# Patient Record
Sex: Male | Born: 1965 | Race: White | Hispanic: No | Marital: Married | Smoking: Former smoker
Health system: Southern US, Community
[De-identification: ages and names within clinical notes are randomized; demographics above are authoritative.]

## PROBLEM LIST (undated history)

## (undated) DIAGNOSIS — M1990 Unspecified osteoarthritis, unspecified site: Secondary | ICD-10-CM

## (undated) DIAGNOSIS — K219 Gastro-esophageal reflux disease without esophagitis: Secondary | ICD-10-CM

## (undated) DIAGNOSIS — Z87442 Personal history of urinary calculi: Secondary | ICD-10-CM

## (undated) DIAGNOSIS — E119 Type 2 diabetes mellitus without complications: Secondary | ICD-10-CM

## (undated) DIAGNOSIS — G709 Myoneural disorder, unspecified: Secondary | ICD-10-CM

## (undated) DIAGNOSIS — I10 Essential (primary) hypertension: Secondary | ICD-10-CM

## (undated) HISTORY — PX: OTHER SURGICAL HISTORY: SHX169

## (undated) HISTORY — PX: KIDNEY STONE SURGERY: SHX686

---

## 2005-11-28 ENCOUNTER — Encounter: Admission: RE | Admit: 2005-11-28 | Discharge: 2005-11-28 | Payer: Self-pay | Admitting: Orthopedic Surgery

## 2014-05-10 ENCOUNTER — Other Ambulatory Visit: Payer: Self-pay | Admitting: *Deleted

## 2015-07-03 DIAGNOSIS — I10 Essential (primary) hypertension: Secondary | ICD-10-CM | POA: Insufficient documentation

## 2015-07-03 DIAGNOSIS — E785 Hyperlipidemia, unspecified: Secondary | ICD-10-CM | POA: Insufficient documentation

## 2015-07-03 DIAGNOSIS — E119 Type 2 diabetes mellitus without complications: Secondary | ICD-10-CM | POA: Insufficient documentation

## 2015-08-01 DIAGNOSIS — I700 Atherosclerosis of aorta: Secondary | ICD-10-CM | POA: Insufficient documentation

## 2015-08-01 DIAGNOSIS — M5136 Other intervertebral disc degeneration, lumbar region: Secondary | ICD-10-CM | POA: Insufficient documentation

## 2015-08-01 DIAGNOSIS — M159 Polyosteoarthritis, unspecified: Secondary | ICD-10-CM | POA: Insufficient documentation

## 2015-08-01 DIAGNOSIS — M51369 Other intervertebral disc degeneration, lumbar region without mention of lumbar back pain or lower extremity pain: Secondary | ICD-10-CM | POA: Insufficient documentation

## 2015-10-09 DIAGNOSIS — R319 Hematuria, unspecified: Secondary | ICD-10-CM | POA: Insufficient documentation

## 2016-02-16 DIAGNOSIS — Z79899 Other long term (current) drug therapy: Secondary | ICD-10-CM | POA: Insufficient documentation

## 2016-11-23 DIAGNOSIS — K219 Gastro-esophageal reflux disease without esophagitis: Secondary | ICD-10-CM | POA: Insufficient documentation

## 2017-06-13 ENCOUNTER — Other Ambulatory Visit: Payer: Self-pay | Admitting: Orthopedic Surgery

## 2017-06-14 ENCOUNTER — Encounter (HOSPITAL_BASED_OUTPATIENT_CLINIC_OR_DEPARTMENT_OTHER): Payer: Self-pay | Admitting: *Deleted

## 2017-06-15 ENCOUNTER — Other Ambulatory Visit: Payer: Self-pay

## 2017-06-15 ENCOUNTER — Encounter (HOSPITAL_BASED_OUTPATIENT_CLINIC_OR_DEPARTMENT_OTHER): Payer: Self-pay | Admitting: *Deleted

## 2017-06-15 NOTE — H&P (View-Only) (Signed)
   06/15/17 1220  OBSTRUCTIVE SLEEP APNEA  Have you ever been diagnosed with sleep apnea through a sleep study? No  Do you snore loudly (loud enough to be heard through closed doors)?  0  Do you often feel tired, fatigued, or sleepy during the daytime (such as falling asleep during driving or talking to someone)? 0  Has anyone observed you stop breathing during your sleep? 0  Do you have, or are you being treated for high blood pressure? 1  BMI more than 35 kg/m2? 1  Age > 50 (1-yes) 1  Neck circumference greater than:Male 16 inches or larger, Male 17inches or larger? 0  Male Gender (Yes=1) 1  Obstructive Sleep Apnea Score 4   

## 2017-06-15 NOTE — Progress Notes (Signed)
   06/15/17 1220  OBSTRUCTIVE SLEEP APNEA  Have you ever been diagnosed with sleep apnea through a sleep study? No  Do you snore loudly (loud enough to be heard through closed doors)?  0  Do you often feel tired, fatigued, or sleepy during the daytime (such as falling asleep during driving or talking to someone)? 0  Has anyone observed you stop breathing during your sleep? 0  Do you have, or are you being treated for high blood pressure? 1  BMI more than 35 kg/m2? 1  Age > 50 (1-yes) 1  Neck circumference greater than:Male 16 inches or larger, Male 17inches or larger? 0  Male Gender (Yes=1) 1  Obstructive Sleep Apnea Score 4

## 2017-06-16 NOTE — H&P (Addendum)
Russell Mckenzie is an 52 y.o. male.   CC / Reason for Visit: Left thumb pain HPI: This patient is a 52 year old male who presents for evaluation of pain at the base of the left thumb.  He has apparently been previously evaluated and treated in [place], as well as one time in [facility] by Dr. Stanford Scotland.  He reports he has had 3 injections and only short-term relief following each.  He has used several types of braces, and still has incapacitating thumb pain.  He understands there are 3 main approaches to treatment at this point surgically, including fusion, implant arthroplasty, and essentially a form of suspension arthroplasty.  He is ready to proceed surgically at this point.  Past Medical History:  Diagnosis Date  . Arthritis    OA left thumb  . Diabetes mellitus without complication (HCC)   . GERD (gastroesophageal reflux disease)   . History of kidney stones   . Hypertension   . Neuromuscular disorder [facility])     Past Surgical History:  Procedure Laterality Date  . KIDNEY STONE SURGERY      History reviewed. No pertinent family history. Social History:  reports that he has been smoking.  His smokeless tobacco use includes snuff. He reports that he does not drink alcohol or use drugs.  Allergies:  Allergies  Allergen Reactions  . Penicillins Anaphylaxis    Can't breathe    No medications prior to admission.    No results found for this or any previous visit (from the past 48 hour(s)). No results found.  Review of Systems  All other systems reviewed and are negative.   Height 6\' 3"  (1.905 m), weight (!) 137 kg (302 lb). Physical Exam  Constitutional:  WD, WN, NAD HEENT:  NCAT, EOMI Neuro/Psych:  Alert & oriented to person, place, and time; appropriate mood & affect Lymphatic: No generalized UE edema or lymphadenopathy Extremities / MSK:  Both UE are normal with respect to appearance, ranges of motion, joint stability, muscle strength/tone, sensation, & perfusion except  as otherwise noted:  The left thumb is exquisitely tender at the [facility] joint, worsened with the slightest bit of TMC stress and grind testing.  Crepitus is noted.  MP joint is stable both who applied radial and ulnar stress, as well as hyperextension stress, with only a few degrees of hyperextension at the MP joint.  Key pinch: Right 20/left 22 but tip pinch: Right 13/left 2 and painful  Labs / Xrays:  Multiple views of the left hand to include TMC series reveal TMC osteoarthritis with full-thickness cartilage loss, with relatively well-preserved other joint spaces.  About 25% subluxation also noted.  Assessment: Grade 3 left TMC osteoarthritis  Plan:  I discussed these findings with him and reviewed brought in specific options.  I recommended implant arthroplasty with the Stablyx implant.  I reviewed expectations and course for recovery and rehab.  We discussed the likelihood of remaining out of work for about a week or so, then possibility of returning to work with restrictions.  Obviously, this would have to be condition driven and open for modification. He will contact Rosanne Sack, our surgery coordinator regarding further planning.  The details of the operative procedure were discussed with the patient.  Questions were invited and answered.  In addition to the goal of the procedure, the risks of the procedure to include but not limited to bleeding; infection; damage to the nerves or blood vessels that could result in bleeding, numbness, weakness, chronic pain, and  the need for additional procedures; stiffness; the need for revision surgery; and anesthetic risks were reviewed.  No specific outcome was guaranteed or implied.  Informed consent was obtained.  Jodi Marbleavid A Caraline Deutschman, MD 06/16/2017, 5:06 PM

## 2017-06-17 ENCOUNTER — Encounter (HOSPITAL_BASED_OUTPATIENT_CLINIC_OR_DEPARTMENT_OTHER)
Admission: RE | Admit: 2017-06-17 | Discharge: 2017-06-17 | Disposition: A | Discharge status: Home / Self Care | Payer: 59 | Source: Ambulatory Visit | Attending: Orthopedic Surgery | Admitting: Orthopedic Surgery

## 2017-06-17 DIAGNOSIS — Z880 Allergy status to penicillin: Secondary | ICD-10-CM | POA: Diagnosis not present

## 2017-06-17 DIAGNOSIS — I10 Essential (primary) hypertension: Secondary | ICD-10-CM | POA: Diagnosis not present

## 2017-06-17 DIAGNOSIS — Z79899 Other long term (current) drug therapy: Secondary | ICD-10-CM | POA: Diagnosis not present

## 2017-06-17 DIAGNOSIS — K219 Gastro-esophageal reflux disease without esophagitis: Secondary | ICD-10-CM | POA: Diagnosis not present

## 2017-06-17 DIAGNOSIS — E119 Type 2 diabetes mellitus without complications: Secondary | ICD-10-CM | POA: Diagnosis not present

## 2017-06-17 DIAGNOSIS — Z7984 Long term (current) use of oral hypoglycemic drugs: Secondary | ICD-10-CM | POA: Diagnosis not present

## 2017-06-17 DIAGNOSIS — F17220 Nicotine dependence, chewing tobacco, uncomplicated: Secondary | ICD-10-CM | POA: Diagnosis not present

## 2017-06-17 DIAGNOSIS — M1812 Unilateral primary osteoarthritis of first carpometacarpal joint, left hand: Secondary | ICD-10-CM | POA: Diagnosis not present

## 2017-06-17 LAB — BASIC METABOLIC PANEL
Anion gap: 10 (ref 5–15)
BUN: 12 mg/dL (ref 6–20)
CHLORIDE: 106 mmol/L (ref 101–111)
CO2: 23 mmol/L (ref 22–32)
CREATININE: 0.74 mg/dL (ref 0.61–1.24)
Calcium: 9.2 mg/dL (ref 8.9–10.3)
GFR calc non Af Amer: >60 mL/min (ref >60)
GLUCOSE: 102 mg/dL — AB (ref 65–99)
Potassium: 4.4 mmol/L (ref 3.5–5.1)
Sodium: 139 mmol/L (ref 135–145)

## 2017-06-20 ENCOUNTER — Ambulatory Visit (HOSPITAL_BASED_OUTPATIENT_CLINIC_OR_DEPARTMENT_OTHER)
Admission: RE | Admit: 2017-06-20 | Discharge: 2017-06-20 | Disposition: A | Discharge status: Home / Self Care | Payer: 59 | Source: Ambulatory Visit | Attending: Orthopedic Surgery | Admitting: Orthopedic Surgery

## 2017-06-20 ENCOUNTER — Encounter (HOSPITAL_BASED_OUTPATIENT_CLINIC_OR_DEPARTMENT_OTHER): Payer: Self-pay

## 2017-06-20 ENCOUNTER — Ambulatory Visit (HOSPITAL_COMMUNITY): Payer: 59

## 2017-06-20 ENCOUNTER — Other Ambulatory Visit: Payer: Self-pay

## 2017-06-20 ENCOUNTER — Ambulatory Visit (HOSPITAL_BASED_OUTPATIENT_CLINIC_OR_DEPARTMENT_OTHER): Payer: 59 | Admitting: Certified Registered"

## 2017-06-20 ENCOUNTER — Encounter (HOSPITAL_BASED_OUTPATIENT_CLINIC_OR_DEPARTMENT_OTHER)
Admission: RE | Disposition: A | Discharge status: Home / Self Care | Payer: Self-pay | Source: Ambulatory Visit | Attending: Orthopedic Surgery

## 2017-06-20 DIAGNOSIS — M1812 Unilateral primary osteoarthritis of first carpometacarpal joint, left hand: Secondary | ICD-10-CM | POA: Insufficient documentation

## 2017-06-20 DIAGNOSIS — M1990 Unspecified osteoarthritis, unspecified site: Secondary | ICD-10-CM

## 2017-06-20 DIAGNOSIS — K219 Gastro-esophageal reflux disease without esophagitis: Secondary | ICD-10-CM | POA: Insufficient documentation

## 2017-06-20 DIAGNOSIS — E119 Type 2 diabetes mellitus without complications: Secondary | ICD-10-CM | POA: Insufficient documentation

## 2017-06-20 DIAGNOSIS — F17220 Nicotine dependence, chewing tobacco, uncomplicated: Secondary | ICD-10-CM | POA: Insufficient documentation

## 2017-06-20 DIAGNOSIS — I10 Essential (primary) hypertension: Secondary | ICD-10-CM | POA: Insufficient documentation

## 2017-06-20 DIAGNOSIS — Z79899 Other long term (current) drug therapy: Secondary | ICD-10-CM | POA: Insufficient documentation

## 2017-06-20 DIAGNOSIS — Z880 Allergy status to penicillin: Secondary | ICD-10-CM | POA: Insufficient documentation

## 2017-06-20 DIAGNOSIS — Z7984 Long term (current) use of oral hypoglycemic drugs: Secondary | ICD-10-CM | POA: Insufficient documentation

## 2017-06-20 HISTORY — DX: Myoneural disorder, unspecified: G70.9

## 2017-06-20 HISTORY — DX: Type 2 diabetes mellitus without complications: E11.9

## 2017-06-20 HISTORY — DX: Essential (primary) hypertension: I10

## 2017-06-20 HISTORY — DX: Personal history of urinary calculi: Z87.442

## 2017-06-20 HISTORY — DX: Gastro-esophageal reflux disease without esophagitis: K21.9

## 2017-06-20 HISTORY — PX: WRIST FUSION: SHX839

## 2017-06-20 HISTORY — DX: Unspecified osteoarthritis, unspecified site: M19.90

## 2017-06-20 LAB — GLUCOSE, CAPILLARY
GLUCOSE-CAPILLARY: 141 mg/dL — AB (ref 65–99)
Glucose-Capillary: 153 mg/dL — ABNORMAL HIGH (ref 65–99)

## 2017-06-20 SURGERY — FUSION, JOINT, WRIST
Anesthesia: General | Site: Wrist | Laterality: Left

## 2017-06-20 MED ORDER — CLONIDINE HCL (ANALGESIA) 100 MCG/ML EP SOLN
EPIDURAL | Status: DC | PRN
  Administered 2017-06-20: 50 ug

## 2017-06-20 MED ORDER — OXYCODONE HCL 5 MG PO TABS: 5.0000 mg | ORAL_TABLET | Freq: Four times a day (QID) | ORAL | 0 refills | Status: DC

## 2017-06-20 MED ORDER — ACETAMINOPHEN 325 MG PO TABS: 650.0000 mg | ORAL_TABLET | Freq: Four times a day (QID) | ORAL | Status: DC

## 2017-06-20 MED ORDER — MEPERIDINE HCL 25 MG/ML IJ SOLN: 6.2500 mg | INTRAMUSCULAR | Status: DC | PRN

## 2017-06-20 MED ORDER — LIDOCAINE HCL (CARDIAC) 20 MG/ML IV SOLN
INTRAVENOUS | Status: AC
  Filled 2017-06-20: qty 15

## 2017-06-20 MED ORDER — OXYCODONE HCL 5 MG PO TABS: 5.0000 mg | ORAL_TABLET | Freq: Once | ORAL | Status: DC | PRN

## 2017-06-20 MED ORDER — MIDAZOLAM HCL 2 MG/2ML IJ SOLN
INTRAMUSCULAR | Status: AC
  Filled 2017-06-20: qty 2

## 2017-06-20 MED ORDER — LACTATED RINGERS IV SOLN: INTRAVENOUS | Status: DC

## 2017-06-20 MED ORDER — PROMETHAZINE HCL 25 MG/ML IJ SOLN: 6.2500 mg | INTRAMUSCULAR | Status: DC | PRN

## 2017-06-20 MED ORDER — LIDOCAINE HCL (CARDIAC) 20 MG/ML IV SOLN
INTRAVENOUS | Status: DC | PRN
  Administered 2017-06-20: 30 mg via INTRAVENOUS

## 2017-06-20 MED ORDER — ONDANSETRON HCL 4 MG/2ML IJ SOLN
INTRAMUSCULAR | Status: AC
  Filled 2017-06-20: qty 12

## 2017-06-20 MED ORDER — EPHEDRINE 5 MG/ML INJ
INTRAVENOUS | Status: AC
  Filled 2017-06-20: qty 10

## 2017-06-20 MED ORDER — CEFAZOLIN SODIUM-DEXTROSE 2-4 GM/100ML-% IV SOLN
INTRAVENOUS | Status: AC
  Filled 2017-06-20: qty 100

## 2017-06-20 MED ORDER — LACTATED RINGERS IV SOLN
INTRAVENOUS | Status: DC
  Administered 2017-06-20: 08:00:00 via INTRAVENOUS

## 2017-06-20 MED ORDER — FENTANYL CITRATE (PF) 100 MCG/2ML IJ SOLN
50.0000 ug | INTRAMUSCULAR | Status: DC | PRN
  Administered 2017-06-20: 100 ug via INTRAVENOUS

## 2017-06-20 MED ORDER — MIDAZOLAM HCL 2 MG/2ML IJ SOLN
1.0000 mg | INTRAMUSCULAR | Status: DC | PRN
  Administered 2017-06-20: 2 mg via INTRAVENOUS

## 2017-06-20 MED ORDER — CEPHALEXIN 500 MG PO CAPS: 500.0000 mg | ORAL_CAPSULE | Freq: Four times a day (QID) | ORAL | 0 refills | Status: AC

## 2017-06-20 MED ORDER — OXYCODONE HCL 5 MG/5ML PO SOLN: 5.0000 mg | Freq: Once | ORAL | Status: DC | PRN

## 2017-06-20 MED ORDER — PROPOFOL 10 MG/ML IV BOLUS
INTRAVENOUS | Status: DC | PRN
  Administered 2017-06-20: 250 mg via INTRAVENOUS

## 2017-06-20 MED ORDER — EPHEDRINE SULFATE 50 MG/ML IJ SOLN
INTRAMUSCULAR | Status: DC | PRN
  Administered 2017-06-20: 15 mg via INTRAVENOUS

## 2017-06-20 MED ORDER — FENTANYL CITRATE (PF) 100 MCG/2ML IJ SOLN
INTRAMUSCULAR | Status: AC
  Filled 2017-06-20: qty 2

## 2017-06-20 MED ORDER — DEXTROSE 5 % IV SOLN
3.0000 g | INTRAVENOUS | Status: AC
  Administered 2017-06-20: 3 g via INTRAVENOUS

## 2017-06-20 MED ORDER — FENTANYL CITRATE (PF) 100 MCG/2ML IJ SOLN: 25.0000 ug | INTRAMUSCULAR | Status: DC | PRN

## 2017-06-20 MED ORDER — PROPOFOL 500 MG/50ML IV EMUL
INTRAVENOUS | Status: AC
  Filled 2017-06-20: qty 50

## 2017-06-20 MED ORDER — PHENYLEPHRINE 40 MCG/ML (10ML) SYRINGE FOR IV PUSH (FOR BLOOD PRESSURE SUPPORT)
PREFILLED_SYRINGE | INTRAVENOUS | Status: AC
  Filled 2017-06-20: qty 10

## 2017-06-20 MED ORDER — SCOPOLAMINE 1 MG/3DAYS TD PT72: 1.0000 | MEDICATED_PATCH | Freq: Once | TRANSDERMAL | Status: DC | PRN

## 2017-06-20 MED ORDER — DEXAMETHASONE SODIUM PHOSPHATE 10 MG/ML IJ SOLN
INTRAMUSCULAR | Status: AC
  Filled 2017-06-20: qty 3

## 2017-06-20 MED ORDER — ROPIVACAINE HCL 7.5 MG/ML IJ SOLN
INTRAMUSCULAR | Status: DC | PRN
  Administered 2017-06-20: 20 mL via PERINEURAL

## 2017-06-20 MED ORDER — DEXAMETHASONE SODIUM PHOSPHATE 10 MG/ML IJ SOLN
INTRAMUSCULAR | Status: DC | PRN
  Administered 2017-06-20: 4 mg via INTRAVENOUS

## 2017-06-20 MED ORDER — ONDANSETRON HCL 4 MG/2ML IJ SOLN
INTRAMUSCULAR | Status: DC | PRN
  Administered 2017-06-20: 4 mg via INTRAVENOUS

## 2017-06-20 SURGICAL SUPPLY — 72 items
APL SKNCLS STERI-STRIP NONHPOA (GAUZE/BANDAGES/DRESSINGS) ×2
BENZOIN TINCTURE PRP APPL 2/3 (GAUZE/BANDAGES/DRESSINGS) ×3 IMPLANT
BIT DRILL 11/64XX180123XX4 (BIT)
BIT DRILL 11/64XX180123XX4.3 (BIT) IMPLANT
BLADE AVERAGE 25MMX9MM (BLADE)
BLADE AVERAGE 25X9 (BLADE) ×1 IMPLANT
BLADE MINI RND TIP GREEN BEAV (BLADE) IMPLANT
BLADE OSC/SAG .038X5.5 CUT EDG (BLADE) ×3 IMPLANT
BLADE SURG 15 STRL LF DISP TIS (BLADE) ×2 IMPLANT
BLADE SURG 15 STRL SS (BLADE) ×4
BNDG CMPR 9X4 STRL LF SNTH (GAUZE/BANDAGES/DRESSINGS) ×2
BNDG COHESIVE 4X5 TAN STRL (GAUZE/BANDAGES/DRESSINGS) ×4 IMPLANT
BNDG ESMARK 4X9 LF (GAUZE/BANDAGES/DRESSINGS) ×4 IMPLANT
BNDG GAUZE ELAST 4 BULKY (GAUZE/BANDAGES/DRESSINGS) ×4 IMPLANT
CHLORAPREP W/TINT 26ML (MISCELLANEOUS) ×4 IMPLANT
CLOSURE WOUND 1/2 X4 (GAUZE/BANDAGES/DRESSINGS) ×1
CORD BIPOLAR FORCEPS 12FT (ELECTRODE) ×4 IMPLANT
COVER BACK TABLE 60X90IN (DRAPES) ×4 IMPLANT
COVER MAYO STAND STRL (DRAPES) ×4 IMPLANT
CUFF TOURNIQUET SINGLE 18IN (TOURNIQUET CUFF) IMPLANT
CUFF TOURNIQUET SINGLE 24IN (TOURNIQUET CUFF) ×3 IMPLANT
DECANTER SPIKE VIAL GLASS SM (MISCELLANEOUS) IMPLANT
DRAPE EXTREMITY T 121X128X90 (DRAPE) ×4 IMPLANT
DRAPE SURG 17X23 STRL (DRAPES) ×4 IMPLANT
DRILL BIT 1/8DIAX5INL DISPOSE (BIT) IMPLANT
DRILL BIT 11/64XX180123XX4.3 (BIT)
DRSG EMULSION OIL 3X3 NADH (GAUZE/BANDAGES/DRESSINGS) ×4 IMPLANT
GAUZE SPONGE 4X4 12PLY STRL LF (GAUZE/BANDAGES/DRESSINGS) ×4 IMPLANT
GLOVE BIO SURGEON STRL SZ7.5 (GLOVE) ×4 IMPLANT
GLOVE BIOGEL PI IND STRL 7.0 (GLOVE) ×2 IMPLANT
GLOVE BIOGEL PI IND STRL 8 (GLOVE) ×2 IMPLANT
GLOVE BIOGEL PI INDICATOR 7.0 (GLOVE) ×2
GLOVE BIOGEL PI INDICATOR 8 (GLOVE) ×2
GLOVE ECLIPSE 6.5 STRL STRAW (GLOVE) ×7 IMPLANT
GLOVE EXAM NITRILE MD LF STRL (GLOVE) ×3 IMPLANT
GOWN STRL REUS W/ TWL LRG LVL3 (GOWN DISPOSABLE) ×4 IMPLANT
GOWN STRL REUS W/TWL LRG LVL3 (GOWN DISPOSABLE) ×8
GOWN STRL REUS W/TWL XL LVL3 (GOWN DISPOSABLE) ×4 IMPLANT
IMPL STABLYX CMC SZ5 (Finger Joint) ×1 IMPLANT
IMPLANT STABLYX CMC SZ5 (Finger Joint) ×4 IMPLANT
K-WIRE .062X4 (WIRE) ×1 IMPLANT
LOOP VESSEL MAXI BLUE (MISCELLANEOUS) IMPLANT
LOOP VESSEL MINI RED (MISCELLANEOUS) IMPLANT
NDL HYPO 25X1 1.5 SAFETY (NEEDLE) IMPLANT
NEEDLE HYPO 25X1 1.5 SAFETY (NEEDLE) ×4 IMPLANT
NS IRRIG 1000ML POUR BTL (IV SOLUTION) ×4 IMPLANT
PACK BASIN DAY SURGERY FS (CUSTOM PROCEDURE TRAY) ×4 IMPLANT
PADDING CAST ABS 4INX4YD NS (CAST SUPPLIES) ×2
PADDING CAST ABS COTTON 4X4 ST (CAST SUPPLIES) ×2 IMPLANT
SLEEVE SCD COMPRESS KNEE MED (MISCELLANEOUS) ×4 IMPLANT
SLING ARM FOAM STRAP XLG (SOFTGOODS) ×3 IMPLANT
SPLINT PLASTER CAST XFAST 3X15 (CAST SUPPLIES) IMPLANT
SPLINT PLASTER CAST XFAST 4X15 (CAST SUPPLIES) ×10 IMPLANT
SPLINT PLASTER XTRA FAST SET 4 (CAST SUPPLIES) ×20
SPLINT PLASTER XTRA FASTSET 3X (CAST SUPPLIES)
STOCKINETTE 6  STRL (DRAPES) ×2
STOCKINETTE 6 STRL (DRAPES) ×2 IMPLANT
STRIP CLOSURE SKIN 1/2X4 (GAUZE/BANDAGES/DRESSINGS) ×2 IMPLANT
SUCTION FRAZIER HANDLE 10FR (MISCELLANEOUS) ×2
SUCTION TUBE FRAZIER 10FR DISP (MISCELLANEOUS) ×1 IMPLANT
SUT ETHIBOND 2-0 V-5 NDL (SUTURE) IMPLANT
SUT ETHIBOND 2-0 V-5 NEEDLE (SUTURE) ×4 IMPLANT
SUT ETHIBOND 3-0 V-5 (SUTURE) ×4 IMPLANT
SUT STEEL 4 (SUTURE) ×1 IMPLANT
SUT VICRYL RAPIDE 4-0 (SUTURE) IMPLANT
SUT VICRYL RAPIDE 4/0 PS 2 (SUTURE) ×4 IMPLANT
SYR 10ML LL (SYRINGE) IMPLANT
SYR BULB 3OZ (MISCELLANEOUS) ×4 IMPLANT
TOWEL OR 17X24 6PK STRL BLUE (TOWEL DISPOSABLE) ×4 IMPLANT
TUBE CONNECTING 20'X1/4 (TUBING) ×1
TUBE CONNECTING 20X1/4 (TUBING) ×2 IMPLANT
UNDERPAD 30X30 (UNDERPADS AND DIAPERS) ×4 IMPLANT

## 2017-06-20 NOTE — Anesthesia Postprocedure Evaluation (Signed)
Anesthesia Post Note  Patient: Russell Mckenzie  Procedure(s) Performed: LEFT THUMB TRAPEZIOMETACARPAL ARTHROPLASTY WITH STABYLX IMPLANT (Left Wrist)     Patient location during evaluation: PACU Anesthesia Type: General Level of consciousness: sedated and patient cooperative Pain management: pain level controlled Vital Signs Assessment: post-procedure vital signs reviewed and stable Respiratory status: spontaneous breathing Cardiovascular status: stable Anesthetic complications: no    Last Vitals:  Vitals:   06/20/17 1015 06/20/17 1030  BP:  127/74  Pulse: 98 87  Resp: 17 16  Temp:    SpO2: 99% 98%    Last Pain:  Vitals:   06/20/17 1015  TempSrc:   PainSc: 0-No pain                 Lewie LoronJohn Eletha Culbertson

## 2017-06-20 NOTE — Anesthesia Preprocedure Evaluation (Addendum)
Anesthesia Evaluation  Patient identified by MRN, date of birth, ID band Patient awake    Reviewed: Allergy & Precautions, NPO status , Patient's Chart, lab work & pertinent test results  Airway Mallampati: II  TM Distance: <3 FB Neck ROM: Full    Dental  (+) Teeth Intact, Dental Advisory Given, Missing,    Pulmonary Current Smoker,    Pulmonary exam normal breath sounds clear to auscultation       Cardiovascular hypertension, Pt. on medications Normal cardiovascular exam Rhythm:Regular Rate:Normal     Neuro/Psych negative neurological ROS  negative psych ROS   GI/Hepatic Neg liver ROS, GERD  Medicated and Controlled,  Endo/Other  diabetes, Type 2, Oral Hypoglycemic AgentsObesity   Renal/GU negative Renal ROS     Musculoskeletal  (+) Arthritis , Osteoarthritis,   LEFT THUMB OSTEOARTHRITIS   Abdominal   Peds  Hematology negative hematology ROS (+)   Anesthesia Other Findings Day of surgery medications reviewed with the patient.  Reproductive/Obstetrics                           Anesthesia Physical Anesthesia Plan  ASA: II  Anesthesia Plan: General   Post-op Pain Management:    Induction: Intravenous  PONV Risk Score and Plan: 1 and Dexamethasone, Ondansetron and Midazolam  Airway Management Planned: LMA  Additional Equipment:   Intra-op Plan:   Post-operative Plan: Extubation in OR  Informed Consent: I have reviewed the patients History and Physical, chart, labs and discussed the procedure including the risks, benefits and alternatives for the proposed anesthesia with the patient or authorized representative who has indicated his/her understanding and acceptance.   Dental advisory given  Plan Discussed with: CRNA  Anesthesia Plan Comments:        Anesthesia Quick Evaluation

## 2017-06-20 NOTE — Transfer of Care (Signed)
Immediate Anesthesia Transfer of Care Note  Patient: Berenda MoraleJerry W Risko  Procedure(s) Performed: LEFT TRAPEZIOMETACARPAL ARTHROPLASTY WITH STABYLX IMPLANT (Left Wrist)  Patient Location: PACU  Anesthesia Type:GA combined with regional for post-op pain  Level of Consciousness: awake, alert , oriented and patient cooperative  Airway & Oxygen Therapy: Patient Spontanous Breathing and Patient connected to face mask oxygen  Post-op Assessment: Report given to RN and Post -op Vital signs reviewed and stable  Post vital signs: Reviewed and stable  Last Vitals:  Vitals:   06/20/17 1012 06/20/17 1013  BP: 120/85   Pulse:  (!) 107  Resp:    Temp:    SpO2:  96%    Last Pain:  Vitals:   06/20/17 0722  TempSrc: Oral  PainSc: 3       Patients Stated Pain Goal: 3 (06/20/17 0722)  Complications: No apparent anesthesia complications

## 2017-06-20 NOTE — Anesthesia Procedure Notes (Signed)
Anesthesia Regional Block: Supraclavicular block   Pre-Anesthetic Checklist: ,, timeout performed, Correct Patient, Correct Site, Correct Laterality, Correct Procedure, Correct Position, site marked, Risks and benefits discussed,  Surgical consent,  Pre-op evaluation,  At surgeon's request and post-op pain management  Laterality: Left  Prep: chloraprep       Needles:  Injection technique: Single-shot  Needle Type: Echogenic Needle     Needle Length: 9cm  Needle Gauge: 21     Additional Needles:   Procedures:,,,, ultrasound used (permanent image in chart),,,,  Narrative:  Start time: 06/20/2017 8:03 AM End time: 06/20/2017 8:08 AM Injection made incrementally with aspirations every 5 mL.  Performed by: Personally  Anesthesiologist: Cecile Hearingurk, Armour Villanueva Edward, MD  Additional Notes: No pain on injection. No increased resistance to injection. Injection made in 5cc increments.  Good needle visualization.  Patient tolerated procedure well.

## 2017-06-20 NOTE — Anesthesia Procedure Notes (Signed)
Procedure Name: LMA Insertion Date/Time: 06/20/2017 8:47 AM Performed by: Sheryn BisonBlocker, Daeshaun Specht D, CRNA Pre-anesthesia Checklist: Patient identified, Emergency Drugs available, Suction available and Patient being monitored Patient Re-evaluated:Patient Re-evaluated prior to induction Oxygen Delivery Method: Circle system utilized Preoxygenation: Pre-oxygenation with 100% oxygen Induction Type: IV induction Ventilation: Mask ventilation without difficulty LMA: LMA inserted LMA Size: 5.0 Number of attempts: 1 Airway Equipment and Method: Bite block Placement Confirmation: positive ETCO2 Tube secured with: Tape Dental Injury: Teeth and Oropharynx as per pre-operative assessment

## 2017-06-20 NOTE — Progress Notes (Signed)
Assisted Dr. Turk with left, ultrasound guided, supraclavicular block. Side rails up, monitors on throughout procedure. See vital signs in flow sheet. Tolerated Procedure well. 

## 2017-06-20 NOTE — Discharge Instructions (Signed)
Discharge Instructions   You have a dressing with a plaster splint incorporated in it. Move your fingers as much as possible, making a full fist and fully opening the fist. Elevate your hand to reduce pain & swelling of the digits.  Ice over the operative site may be helpful to reduce pain & swelling.  DO NOT USE HEAT. Pain medicine has been prescribed for you.  Continue to take Meloxicam as prescribed.DO NOT TAKE ANY OTHER NSAIDS WITH THIS DRUG! Take Tylenol OTC 650 mg every 6 hours. Take the oxycodone 5 mg as a rescue medicine in addition to the other medications. Leave the dressing in place until you return to our office.  You may shower, but keep the bandage clean & dry.  You may drive a car when you are off of prescription pain medications and can safely control your vehicle with both hands. Our office will call you to arrange follow-up   Please call [phone] during normal business hours or [phone] after hours for any problems. Including the following:  - excessive redness of the incisions - drainage for more than 4 days - fever of more than 101.5 F  *Please note that pain medications will not be refilled after hours or on weekends.  Work Status: No lifting, gripping, or grasping greater than pencil and paper tasks. Must wear splint and keep it clean and dry. May return to work on 06/27/2017 with the previously stated restrictions.  Regional Anesthesia Blocks  1. Numbness or the inability to move the "blocked" extremity may last from 3-48 hours after placement. The length of time depends on the medication injected and your individual response to the medication. If the numbness is not going away after 48 hours, call your surgeon.  2. The extremity that is blocked will need to be protected until the numbness is gone and the  Strength has returned. Because you cannot feel it, you will need to take extra care to avoid injury. Because it may be weak, you may have difficulty moving  it or using it. You may not know what position it is in without looking at it while the block is in effect.  3. For blocks in the legs and feet, returning to weight bearing and walking needs to be done carefully. You will need to wait until the numbness is entirely gone and the strength has returned. You should be able to move your leg and foot normally before you try and bear weight or walk. You will need someone to be with you when you first try to ensure you do not fall and possibly risk injury.  4. Bruising and tenderness at the needle site are common side effects and will resolve in a few days.  5. Persistent numbness or new problems with movement should be communicated to the surgeon or the [facility] [phone])/ [facility] [phone]).   Post Anesthesia Home Care Instructions  Activity: Get plenty of rest for the remainder of the day. A responsible individual must stay with you for 24 hours following the procedure.  For the next 24 hours, DO NOT: -Drive a car -Advertising copywriterperate machinery -Drink alcoholic beverages -Take any medication unless instructed by your physician -Make any legal decisions or sign important papers.  Meals: Start with liquid foods such as gelatin or soup. Progress to regular foods as tolerated. Avoid greasy, spicy, heavy foods. If nausea and/or vomiting occur, drink only clear liquids until the nausea and/or vomiting subsides. Call your physician if  vomiting continues.  Special Instructions/Symptoms: Your throat may feel dry or sore from the anesthesia or the breathing tube placed in your throat during surgery. If this causes discomfort, gargle with warm salt water. The discomfort should disappear within 24 hours.  If you had a scopolamine patch placed behind your ear for the management of post- operative nausea and/or vomiting:  1. The medication in the patch is effective for 72 hours, after which it should be removed.  Wrap patch in a  tissue and discard in the trash. Wash hands thoroughly with soap and water. 2. You may remove the patch earlier than 72 hours if you experience unpleasant side effects which may include dry mouth, dizziness or visual disturbances. 3. Avoid touching the patch. Wash your hands with soap and water after contact with the patch.

## 2017-06-20 NOTE — Op Note (Signed)
06/20/2017  7:44 AM  PATIENT:  Russell Mckenzie  52 y.o. male  PRE-OPERATIVE DIAGNOSIS:  Left thumb TMC OA  POST-OPERATIVE DIAGNOSIS:  Same  PROCEDURE:  Left thumb CMC implant arthroplasty, [phone]  SURGEON: Cliffton Astersavid A. Janee Mornhompson, MD  PHYSICIAN ASSISTANT: Danielle RankinKirsten Schrader, OPA-C  ANESTHESIA:  regional and general  SPECIMENS:  None  DRAINS:   None  EBL:  less than 50 mL  PREOPERATIVE INDICATIONS:  Russell Mckenzie is a  52 y.o. male with left thumb TMC OA, and has only had transient relief with previous steroid injections  The risks benefits and alternatives were discussed with the patient preoperatively including but not limited to the risks of infection, bleeding, nerve injury, cardiopulmonary complications, the need for revision surgery, among others, and the patient verbalized understanding and consented to proceed.  OPERATIVE IMPLANTS: [employer] Stablyx implant, size 4  OPERATIVE PROCEDURE:  After receiving prophylactic antibiotics and a regional block, the patient was escorted to the operative theatre and placed in a supine position.  A surgical "time-out" was performed during which the planned procedure, proposed operative site, and the correct patient identity were compared to the operative consent and agreement confirmed by the circulating nurse according to current facility policy.  Following application of a tourniquet to the operative extremity, the exposed skin was prepped with Chloraprep and draped in the usual sterile fashion.  The limb was exsanguinated with an Esmarch bandage and the tourniquet inflated to approximately 100mmHg higher than systolic BP.  A direct linear incision about 4 cm was made dorsally on the thumb, along the EPB tendon path.  Subcutaneous tissues were dissected with a combination of blunt and spreading dissection with care to protect and preserve the cutaneous nerve branches.  The EPB tendon sheath was opened and the tendon reflected ulnarly.  The  first compartment sheath was incised, both along the EPB tendon and the APL, as there was a septum between the 2.  The radial artery was observed and dissected free sufficiently to mobilize it out of the remainder of the exposure.  [facility] was localized with a needle and an ulnarly based capsular flap created, about two thirds on the metacarpal base and one third on the trapezium.  The capsular flap was reflected.  The location and orientation of the metacarpal base resection was confirmed fluoroscopically and then made with a saw.  The base of the metacarpal was removed from the wound.  Synovectomy was performed.  FCR was not compromised.  The trapezium was inspected, still with some cartilage remaining, although thin and more thin out medially.  The capsule reflector was used to reflect the capsule off the volar lip of the trapezium, this was followed by contouring with the rasp and the planar.  The metacarpal canal was then prepared first with a canal finder, then with the beginning rasp, followed by the planar.  Ultimately it was rasped up to a 3.  Trials were used on the trapezium, and ultimately it was decided to go with a size 4.  Out medially, the trapezium was a little flatter, and so to make the curve of the guide fit against the broad portion of bone rather than rim- riding, a small bit of dorsal rim was excised.  Once this was done, a 4 trial was placed.  A 5 was also placed, but it was felt them for was more appropriate.  Having selected the 4, the saw was used to create a path for the keel of  the implant, the wounds were irrigated and the implant placed and impacted.  The hand could be lifted by the thumb translating dorsally, without the joint dislocating.  He did not appear to be overstuffed either.  It is tracking against the trapezium was confirmed fluoroscopically and final images were obtained.  The wound was again irrigated and the capsule closed with 3-0 Ethibond interrupted and running  suture.  The implant was stable with good motion following capsular closure.  Tourniquet was released, the wound irrigated, and the skin was closed with 4-0 Vicryl Rapide deep dermal interrupted sutures followed by benzoin and Steri-Strips.  A short arm thumb spica splint dressing was applied and he was awakened and taken to the recovery room in stable condition, breathing spontaneously.  DISPOSITION: He will be discharged home today with typical instructions, returning in 10-15 days, at which time we will place a short arm thumb spica cast for just a couple weeks before transitioning to a removable splint with therapy, etc.

## 2017-06-20 NOTE — Interval H&P Note (Signed)
History and Physical Interval Note:  06/20/2017 7:43 AM  Russell Mckenzie  has presented today for surgery, with the diagnosis of LEFT THUMB OSTEOARTHRITIS M18.12  The various methods of treatment have been discussed with the patient and family. After consideration of risks, benefits and other options for treatment, the patient has consented to  Procedure(s): LEFT TRAPEZIOMETACARPAL ARTHROPLASTY WITH STABYLX IMPLANT (Left) vs. Traditional thumb suspension arthroplasty (LRTI) as a surgical intervention .  The patient's history has been reviewed, patient examined, no change in status, stable for surgery.  I have reviewed the patient's chart and labs.  Questions were answered to the patient's satisfaction.     Jodi Marbleavid A Kassie Keng

## 2017-06-21 ENCOUNTER — Encounter (HOSPITAL_BASED_OUTPATIENT_CLINIC_OR_DEPARTMENT_OTHER): Payer: Self-pay | Admitting: Orthopedic Surgery

## 2017-12-06 DIAGNOSIS — J3489 Other specified disorders of nose and nasal sinuses: Secondary | ICD-10-CM | POA: Insufficient documentation

## 2018-06-08 DIAGNOSIS — I872 Venous insufficiency (chronic) (peripheral): Secondary | ICD-10-CM | POA: Insufficient documentation

## 2018-06-08 DIAGNOSIS — L2089 Other atopic dermatitis: Secondary | ICD-10-CM | POA: Insufficient documentation

## 2018-06-08 DIAGNOSIS — E538 Deficiency of other specified B group vitamins: Secondary | ICD-10-CM | POA: Insufficient documentation

## 2018-06-08 DIAGNOSIS — Z720 Tobacco use: Secondary | ICD-10-CM | POA: Insufficient documentation

## 2018-11-02 IMAGING — RF DG FINGER THUMB 2+V*L*
1 series · 2 of 2 positions shown · non-contrast
Comparison: None.

CLINICAL DATA: Arthroplasty left first metacarpal

EXAM:
DG C-ARM 61-120 MIN; LEFT THUMB 2+V

[Series 1: run · 2 of 2 slices shown]
[im 1/2]
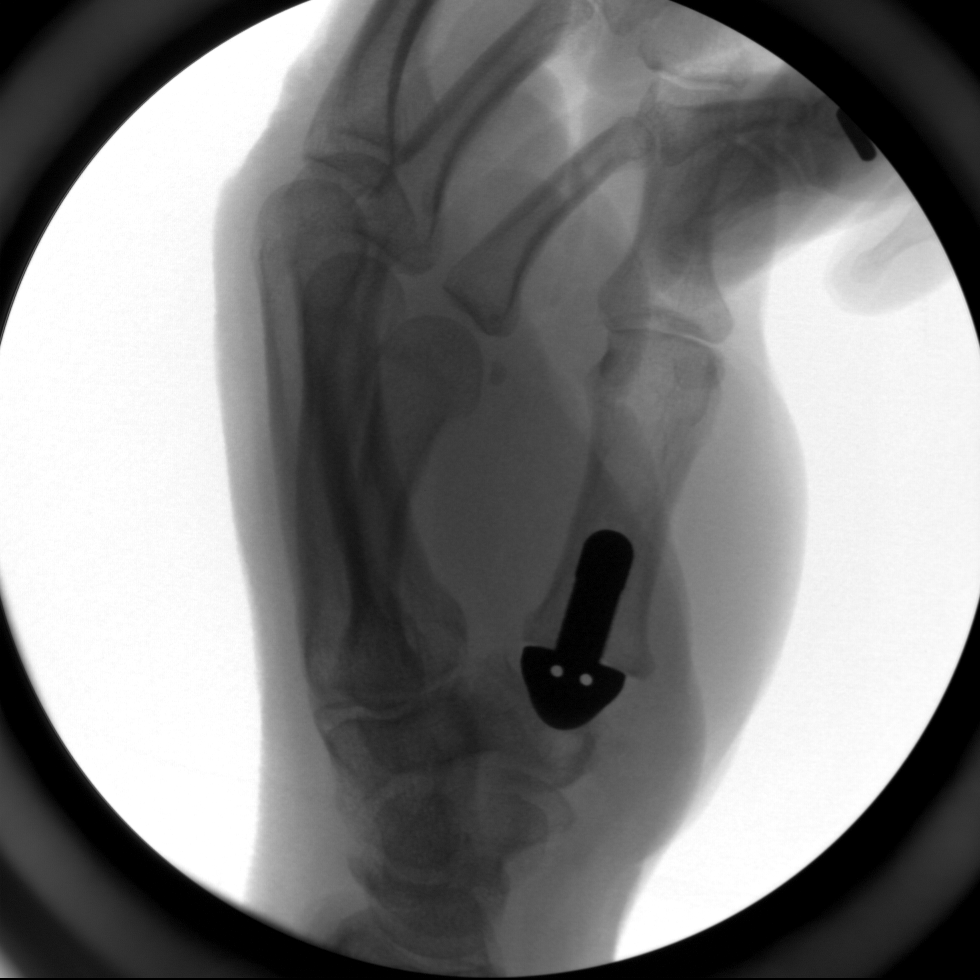
[im 2/2]
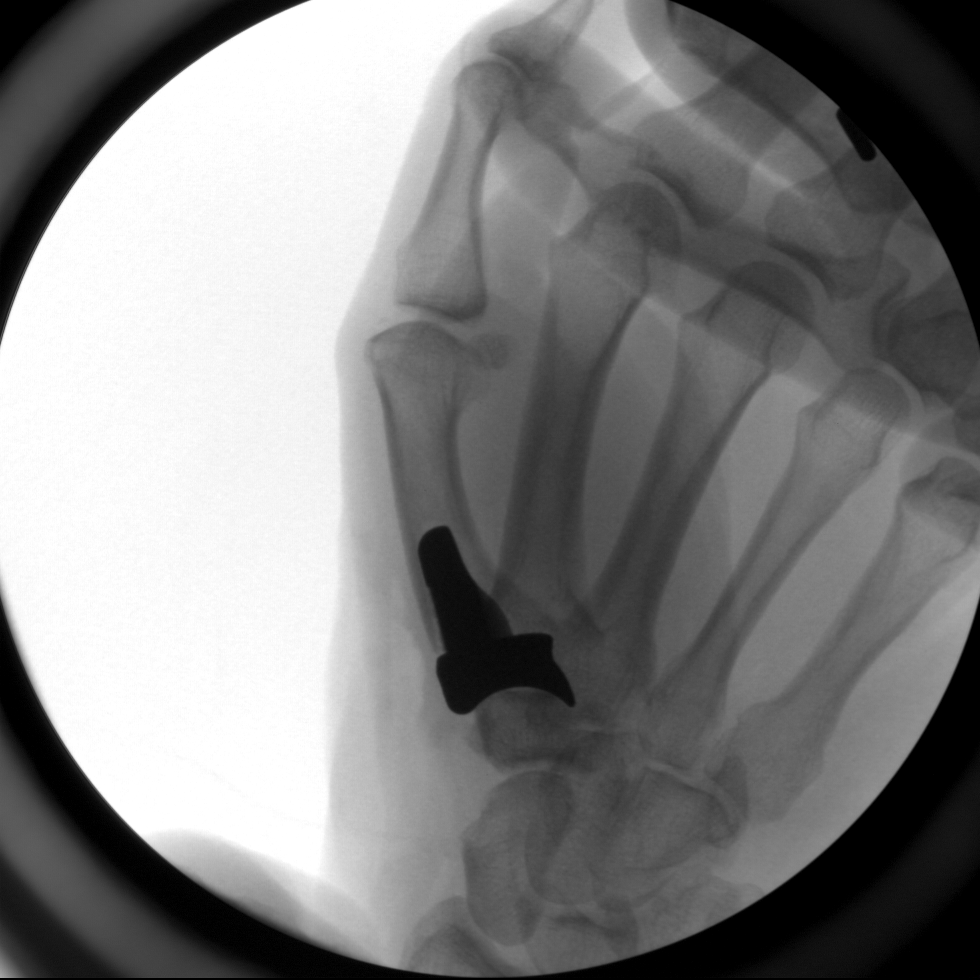

[2 of 2 positions shown; findings below may reference images not displayed]

FINDINGS: Frontal and lateral views obtained. There is a prosthetic device at
the level of the first carpal-metacarpal joint with alignment
anatomic. No acute fracture or dislocation. Other joint spaces
appear unremarkable.
IMPRESSION: Prosthetic device at the first carpal-metacarpal joint with
alignment anatomic in this region. No acute fracture or dislocation.

## 2019-01-02 DIAGNOSIS — U071 COVID-19: Secondary | ICD-10-CM | POA: Insufficient documentation

## 2019-03-12 ENCOUNTER — Other Ambulatory Visit: Payer: Self-pay

## 2019-03-12 ENCOUNTER — Ambulatory Visit: Payer: 59 | Admitting: Podiatry

## 2019-03-12 DIAGNOSIS — E119 Type 2 diabetes mellitus without complications: Secondary | ICD-10-CM | POA: Diagnosis not present

## 2019-03-12 DIAGNOSIS — M79676 Pain in unspecified toe(s): Secondary | ICD-10-CM

## 2019-03-12 DIAGNOSIS — L600 Ingrowing nail: Secondary | ICD-10-CM

## 2019-03-12 MED ORDER — NEOMYCIN-POLYMYXIN-HC 3.5-10000-1 OT SOLN: OTIC | 0 refills | Status: DC

## 2019-03-12 NOTE — Patient Instructions (Signed)

## 2019-03-12 NOTE — Progress Notes (Signed)
Subjective:  Patient ID: Russell Mckenzie, male    DOB: 10-Dec-1965,  MRN: [id number]  Chief Complaint  Patient presents with  . Nail Problem    LT hallux medial border x 2 wks; 8/10 tender to the tough  -pt denies swellign/drainage -w/ redness Tx: lidocaine and trimming  . Diabetes    FBS: don't check A1C: "IDK" PCP: Shary Decamp    53 y.o. male presents with the above complaint. Hx as above.  Review of Systems: Negative except as noted in the HPI. Denies N/V/F/Ch.  Past Medical History:  Diagnosis Date  . Arthritis    OA left thumb  . Diabetes mellitus without complication (HCC)   . GERD (gastroesophageal reflux disease)   . History of kidney stones   . Hypertension   . Neuromuscular disorder (HCC)     Current Outpatient Medications:  .  aspirin EC 81 MG tablet, Take 81 mg by mouth daily., Disp: , Rfl:  .  atorvastatin (LIPITOR) 10 MG tablet, Take 10 mg by mouth daily., Disp: , Rfl:  .  DM-Phenylephrine-Acetaminophen (ALKA-SELTZER PLS SINUS & COUGH) 10-5-325 MG CAPS, Take by mouth., Disp: , Rfl:  .  glimepiride (AMARYL) 1 MG tablet, Take 1 mg by mouth daily., Disp: , Rfl:  .  lisinopril (ZESTRIL) 20 MG tablet, Take 20 mg by mouth daily., Disp: , Rfl:  .  meloxicam (MOBIC) 7.5 MG tablet, Take 7.5 mg by mouth daily., Disp: , Rfl:  .  metFORMIN (GLUCOPHAGE) 500 MG tablet, Take 500 mg by mouth 2 (two) times daily with a meal., Disp: , Rfl:  .  neomycin-polymyxin-hydrocortisone (CORTISPORIN) OTIC solution, Apply 2-3 drops to the ingrown toenail site twice daily. Cover with band-aid., Disp: 10 mL, Rfl: 0 .  omeprazole (PRILOSEC) 20 MG capsule, Take 20 mg by mouth daily., Disp: , Rfl:  .  saxagliptin HCl (ONGLYZA) 2.5 MG TABS tablet, Take 5 mg by mouth daily., Disp: , Rfl:  .  vitamin B-12 (CYANOCOBALAMIN) 1000 MCG tablet, Take 1,000 mcg by mouth daily., Disp: , Rfl:   Social History   Tobacco Use  Smoking Status Current Every Day Smoker  Smokeless Tobacco Current User  . Types: Snuff   Tobacco Comment   2-3 cigs/day    Allergies  Allergen Reactions  . Penicillins Rash   Objective:  There were no vitals filed for this visit. There is no height or weight on file to calculate BMI. Constitutional Well developed. Well nourished.  Vascular Dorsalis pedis pulses palpable bilaterally. Posterior tibial pulses palpable bilaterally. Capillary refill normal to all digits.  No cyanosis or clubbing noted. Pedal hair growth normal.  Neurologic Normal speech. Oriented to person, place, and time. Epicritic sensation to light touch grossly present bilaterally.  Dermatologic Painful ingrowing nail at medial nail borders of the hallux nail left; incurvated border lateral. Right hallux nail dystrophy No other open wounds. No skin lesions.  Orthopedic: Normal joint ROM without pain or crepitus bilaterally. No visible deformities. No bony tenderness.   Radiographs: None Assessment:   1. Ingrown nail   2. Pain around toenail   3. Encounter for diabetic foot exam [facility])    Plan:  Patient was evaluated and treated and all questions answered.  Ingrown Nail, left -Patient elects to proceed with minor surgery to remove ingrown toenail removal today. Consent reviewed and signed by patient. -Ingrown nail excised. See procedure note. -Educated on post-procedure care including soaking. Written instructions provided and reviewed. -Patient to follow up in 2 weeks for nail check.  Procedure: Excision of Ingrown Toenail Location: Left 1st both nail borders. Anesthesia: Lidocaine 1% plain; 1.5 mL and Marcaine 0.5% plain; 1.5 mL, digital block. Skin Prep: Betadine. Dressing: Silvadene; telfa; dry, sterile, compression dressing. Technique: Following skin prep, the toe was exsanguinated and a tourniquet was secured at the base of the toe. The affected nail border was freed, split with a nail splitter, and excised. Chemical matrixectomy was then performed with phenol and irrigated out with  alcohol. The tourniquet was then removed and sterile dressing applied. Disposition: Patient tolerated procedure well. Patient to return in 2 weeks for follow-up.   Return in about 2 weeks (around 03/26/2019).

## 2019-03-23 ENCOUNTER — Telehealth: Payer: Self-pay | Admitting: Urology

## 2019-03-23 NOTE — Telephone Encounter (Signed)
Pt wife called and said that his toes are really infected and didn't know if you wanted to send an abx to the [facility] on [address]. Pt has an appointment with you on Monday at 8:15. Ill call her back when you send it. Thank you!!

## 2019-03-26 ENCOUNTER — Other Ambulatory Visit: Payer: Self-pay

## 2019-03-26 ENCOUNTER — Ambulatory Visit: Payer: 59 | Admitting: Podiatry

## 2019-03-26 DIAGNOSIS — M79676 Pain in unspecified toe(s): Secondary | ICD-10-CM

## 2019-03-26 DIAGNOSIS — L600 Ingrowing nail: Secondary | ICD-10-CM

## 2019-03-26 NOTE — Progress Notes (Signed)
  Subjective:  Patient ID: Russell Mckenzie, male    DOB: 09/04/1965,  MRN: [id number]  Chief Complaint  Patient presents with  . nail check    F/U Lt hallux nail check," toenail is very sore." -pt denies any improvemetn -w/ redness, swellign and pus  Tx: epsom salt and drops   53 y.o. male returns for the above complaint.   Objective:   General AA&O x3. Normal mood and affect.  Vascular Foot warm and well perfused with good capillary refill.  Neurologic Sensation grossly intact.  Dermatologic Nail avulsion site healing well without drainage or erythema. Nail bed with overlying soft crust. Left intact. No signs of local infection.  Orthopedic: No tenderness to palpation of the toe.   Assessment & Plan:  Patient was evaluated and treated and all questions answered.  S/p Ingrown Toenail Excision, left -Healing well, small corner ingrowing and debrided. -Soak for 1 more week. Abx ointment daily. -Discussed return precautions. -F/u PRN

## 2019-08-10 ENCOUNTER — Telehealth: Payer: Self-pay | Admitting: Podiatry

## 2019-08-10 NOTE — Telephone Encounter (Signed)
I set up a payment arrangement for my husband and I was wanting to pay it over the phone today. Can you call me back at [phone]. Thank you.

## 2020-10-20 ENCOUNTER — Telehealth: Payer: Self-pay | Admitting: Oncology

## 2020-10-20 NOTE — Telephone Encounter (Signed)
Patient referred by Dr Feliciana Rossetti for Anemia. Appt made for 10/27/20 Labs 2:45 pm - Consult 3:15 pm

## 2020-10-26 ENCOUNTER — Other Ambulatory Visit: Payer: Self-pay | Admitting: Oncology

## 2020-10-26 DIAGNOSIS — D649 Anemia, unspecified: Secondary | ICD-10-CM

## 2020-10-27 ENCOUNTER — Inpatient Hospital Stay: Payer: 59 | Attending: Oncology

## 2020-10-27 ENCOUNTER — Encounter: Payer: Self-pay | Admitting: Oncology

## 2020-10-27 ENCOUNTER — Inpatient Hospital Stay (INDEPENDENT_AMBULATORY_CARE_PROVIDER_SITE_OTHER): Payer: 59 | Admitting: Oncology

## 2020-10-27 ENCOUNTER — Other Ambulatory Visit: Payer: Self-pay

## 2020-10-27 VITALS — BP 142/78 | HR 100 | Temp 98.2°F | Resp 16 | Ht 73.25 in | Wt 293.6 lb

## 2020-10-27 DIAGNOSIS — D649 Anemia, unspecified: Secondary | ICD-10-CM

## 2020-10-27 DIAGNOSIS — R319 Hematuria, unspecified: Secondary | ICD-10-CM | POA: Diagnosis not present

## 2020-10-27 DIAGNOSIS — D500 Iron deficiency anemia secondary to blood loss (chronic): Secondary | ICD-10-CM | POA: Diagnosis not present

## 2020-10-27 DIAGNOSIS — D509 Iron deficiency anemia, unspecified: Secondary | ICD-10-CM | POA: Diagnosis present

## 2020-10-27 LAB — CBC AND DIFFERENTIAL
HCT: 37 — AB (ref 41–53)
Hemoglobin: 12.2 — AB (ref 13.5–17.5)
Neutrophils Absolute: 3.91
Platelets: 332 (ref 150–399)
WBC: 6.2

## 2020-10-27 LAB — HEPATIC FUNCTION PANEL
ALT: 26 (ref 10–40)
AST: 25 (ref 14–40)
Alkaline Phosphatase: 93 (ref 25–125)
Bilirubin, Total: 0.2

## 2020-10-27 LAB — CBC: RBC: 4.68 (ref 3.87–5.11)

## 2020-10-27 LAB — IRON AND TIBC
Iron: 40 ug/dL — ABNORMAL LOW (ref 45–182)
Saturation Ratios: 11 % — ABNORMAL LOW (ref 17.9–39.5)
TIBC: 376 ug/dL (ref 250–450)
UIBC: 336 ug/dL

## 2020-10-27 LAB — COMPREHENSIVE METABOLIC PANEL
Albumin: 4.4 (ref 3.5–5.0)
Calcium: 9.5 (ref 8.7–10.7)

## 2020-10-27 LAB — BASIC METABOLIC PANEL
BUN: 17 (ref 4–21)
CO2: 23 — AB (ref 13–22)
Chloride: 105 (ref 99–108)
Creatinine: 0.7 (ref 0.6–1.3)
Glucose: 125
Potassium: 3.6 (ref 3.4–5.3)
Sodium: 140 (ref 137–147)

## 2020-10-27 LAB — FERRITIN: Ferritin: 146 ng/mL (ref 24–336)

## 2020-10-27 LAB — FOLATE: Folate: 4.3 ng/mL — ABNORMAL LOW (ref >5.9)

## 2020-10-27 LAB — VITAMIN B12: Vitamin B-12: 958 pg/mL — ABNORMAL HIGH (ref 180–914)

## 2020-10-28 ENCOUNTER — Other Ambulatory Visit: Payer: Self-pay | Admitting: Oncology

## 2020-10-28 DIAGNOSIS — D500 Iron deficiency anemia secondary to blood loss (chronic): Secondary | ICD-10-CM

## 2020-10-28 DIAGNOSIS — D508 Other iron deficiency anemias: Secondary | ICD-10-CM

## 2020-10-29 ENCOUNTER — Encounter: Payer: Self-pay | Admitting: Oncology

## 2020-10-29 ENCOUNTER — Telehealth: Payer: Self-pay | Admitting: Oncology

## 2020-10-29 NOTE — Progress Notes (Signed)
..  Feraheme orders changed to venofer due to insurance plan preference.  Message to scheduling has been sent.  

## 2020-10-29 NOTE — Telephone Encounter (Signed)
Per 7/20 Staff Msg, patient scheduled for 3 rounds of Venofer 7/25, 8/1, 8/8 - Gave information to spouse

## 2020-11-03 ENCOUNTER — Other Ambulatory Visit: Payer: Self-pay

## 2020-11-03 ENCOUNTER — Encounter: Payer: Self-pay | Admitting: Oncology

## 2020-11-03 ENCOUNTER — Inpatient Hospital Stay: Payer: 59

## 2020-11-03 VITALS — BP 130/87 | HR 97 | Temp 97.8°F | Resp 18 | Wt 298.1 lb

## 2020-11-03 DIAGNOSIS — D500 Iron deficiency anemia secondary to blood loss (chronic): Secondary | ICD-10-CM

## 2020-11-03 DIAGNOSIS — D509 Iron deficiency anemia, unspecified: Secondary | ICD-10-CM | POA: Diagnosis not present

## 2020-11-03 MED ORDER — SODIUM CHLORIDE 0.9 % IV SOLN
Freq: Once | INTRAVENOUS | Status: AC
  Filled 2020-11-03: qty 250

## 2020-11-03 MED ORDER — SODIUM CHLORIDE 0.9 % IV SOLN
300.0000 mg | Freq: Once | INTRAVENOUS | Status: AC
  Administered 2020-11-03: 300 mg via INTRAVENOUS
  Filled 2020-11-03: qty 300

## 2020-11-03 NOTE — Patient Instructions (Signed)

## 2020-11-03 NOTE — Progress Notes (Signed)
November 03, 2020   Russell Mckenzie  The above named patient had a medical visit today at: 1300.  Please take this into consideration when reviewing the time away from work/school.    Sincerely,    Brnadon Eoff G [name], RN 

## 2020-11-03 NOTE — Progress Notes (Signed)
PT STABLE AT TIME OF DISCHARGE 

## 2020-11-03 NOTE — Progress Notes (Signed)
November 03, 2020   Russell Mckenzie  The above named patient had a medical visit today at: 1300.  Please take this into consideration when reviewing the time away from work/school.    Sincerely,    Garnette Scheuermann, RN

## 2020-11-03 NOTE — Progress Notes (Signed)
Discharged home, ambulatory

## 2020-11-03 NOTE — Progress Notes (Signed)
[facility]  [address] [place],  [place]  [address] [phone]  Clinic Day:  11/03/2020  Referring physician: Gordan Payment., MD   HISTORY OF PRESENT ILLNESS:  The patient is a 55 y.o. male  who I was asked to consult upon for anemia.  Labs in June 2022 showed a mildly low hemoglobin of 12.8, with a borderline low MCV of 80.4.  The patient claims to have intermittent hematuria, for which he is scheduled for a cystoscopy in August 2022.  He also recalls having a black stool 1 month ago.  However, he claims to take Pepto Bismol intermittently.  He takes Celebrex and aspirin daily.  He is not on iron pills.  He had both an EGD and colonoscopy done last year, which came back normal.  To his knowledge, there is no family history of anemia or other hematologic disorders.    PAST MEDICAL HISTORY:   Past Medical History:  Diagnosis Date   Arthritis    OA left thumb   Diabetes mellitus without complication (HCC)    GERD (gastroesophageal reflux disease)    History of kidney stones    Hypertension    Neuromuscular disorder (HCC)     PAST SURGICAL HISTORY:   Past Surgical History:  Procedure Laterality Date   kidney repair     KIDNEY STONE SURGERY     WRIST FUSION Left 06/20/2017   Procedure: LEFT THUMB TRAPEZIOMETACARPAL ARTHROPLASTY WITH STABYLX IMPLANT;  Surgeon: Mack Hook, MD;  Location: [facility];  Service: Orthopedics;  Laterality: Left;    CURRENT MEDICATIONS:   Current Outpatient Medications  Medication Sig Dispense Refill   celecoxib (CELEBREX) 200 MG capsule TAKE 1 CAPSULE(200 MG) BY MOUTH DAILY AS NEEDED FOR PAIN     aspirin EC 81 MG tablet Take 81 mg by mouth daily.     DM-Phenylephrine-Acetaminophen (ALKA-SELTZER PLS SINUS & COUGH) 10-5-325 MG CAPS Take by mouth.     glimepiride (AMARYL) 1 MG tablet Take 1 mg by mouth daily.     metFORMIN (GLUCOPHAGE) 500 MG tablet Take 500 mg by mouth 2 (two) times daily  with a meal.     metFORMIN (GLUCOPHAGE-XR) 500 MG 24 hr tablet Take 1,000 mg by mouth 2 (two) times daily.     omeprazole (PRILOSEC) 20 MG capsule Take 20 mg by mouth daily.     saxagliptin HCl (ONGLYZA) 2.5 MG TABS tablet Take 5 mg by mouth daily.     tamsulosin (FLOMAX) 0.4 MG CAPS capsule Take by mouth.     vitamin B-12 (CYANOCOBALAMIN) 1000 MCG tablet Take 1,000 mcg by mouth daily.     No current facility-administered medications for this visit.    ALLERGIES:   Allergies  Allergen Reactions   Penicillins Rash    FAMILY HISTORY:   Family History  Problem Relation Age of Onset   Thyroid disease Mother    Hyperlipidemia Mother    Diabetes Mother    Rheum arthritis Mother    Cancer Father     SOCIAL HISTORY:  The patient was born in [place].  He lives in [place] with his wife of 27 years.  He has 4 children and 11 grandchildren.  He was a Information systems manager for 27 years.  He smokes 1.5 packs of cigarettes daily x 4 years before quitting 12 years ago.  He still dips tobacco.  He admits to prior heavy alcohol use, but quit drinking 27 years ago.  REVIEW  OF SYSTEMS:  Review of Systems  Constitutional:  Positive for fatigue. Negative for fever and unexpected weight change.  Respiratory:  Negative for chest tightness, cough, hemoptysis and shortness of breath.   Cardiovascular:  Negative for chest pain and palpitations.  Gastrointestinal:  Positive for diarrhea and nausea. Negative for abdominal distention, abdominal pain, blood in stool, constipation and vomiting.  Genitourinary:  Positive for difficulty urinating (slowed urinary stream). Negative for dysuria, frequency and hematuria.   Musculoskeletal:  Positive for arthralgias. Negative for back pain and myalgias.  Skin:  Negative for itching and rash.  Neurological:  Positive for headaches. Negative for dizziness and light-headedness.  Psychiatric/Behavioral:  Negative for depression and suicidal  ideas. The patient is not nervous/anxious.     PHYSICAL EXAM:  Blood pressure (!) 142/78, pulse 100, temperature 98.2 F (36.8 C), temperature source Oral, resp. rate 16, height 6' 1.25" (1.861 m), weight 293 lb 9.6 oz (133.2 kg), SpO2 96 %. Wt Readings from Last 3 Encounters:  11/03/20 298 lb 1.9 oz (135.2 kg)  10/27/20 293 lb 9.6 oz (133.2 kg)  06/20/17 (!) 301 lb 6 oz (136.7 kg)   Body mass index is 38.47 kg/m. Performance status (ECOG): 1 - Symptomatic but completely ambulatory Physical Exam Constitutional:      Appearance: Normal appearance. He is not ill-appearing.  HENT:     Mouth/Throat:     Mouth: Mucous membranes are moist.     Pharynx: Oropharynx is clear. No oropharyngeal exudate or posterior oropharyngeal erythema.  Cardiovascular:     Rate and Rhythm: Normal rate and regular rhythm.     Heart sounds: No murmur heard.   No friction rub. No gallop.  Pulmonary:     Effort: Pulmonary effort is normal. No respiratory distress.     Breath sounds: Normal breath sounds. No wheezing, rhonchi or rales.  Chest:  Breasts:    Right: No axillary adenopathy or supraclavicular adenopathy.     Left: No axillary adenopathy or supraclavicular adenopathy.  Abdominal:     General: Bowel sounds are normal. There is no distension.     Palpations: Abdomen is soft. There is no mass.     Tenderness: There is no abdominal tenderness.  Musculoskeletal:        General: No swelling.     Right lower leg: No edema.     Left lower leg: No edema.  Lymphadenopathy:     Cervical: No cervical adenopathy.     Upper Body:     Right upper body: No supraclavicular or axillary adenopathy.     Left upper body: No supraclavicular or axillary adenopathy.     Lower Body: No right inguinal adenopathy. No left inguinal adenopathy.  Skin:    General: Skin is warm.     Coloration: Skin is not jaundiced.     Findings: No lesion or rash.  Neurological:     General: No focal deficit present.     Mental  Status: He is alert and oriented to person, place, and time. Mental status is at baseline.     Cranial Nerves: Cranial nerves are intact.  Psychiatric:        Mood and Affect: Mood normal.        Behavior: Behavior normal.        Thought Content: Thought content normal.   LABS:    CMP Latest Ref Rng & Units 10/27/2020 06/17/2017  Glucose 65 - 99 mg/dL - 924(Q)  BUN 4 - 21 17 12   Creatinine 0.6 -  1.3 0.7 0.74  Sodium 137 - 147 140 139  Potassium 3.4 - 5.3 3.6 4.4  Chloride 99 - 108 105 106  CO2 13 - 22 23(A) 23  Calcium 8.7 - 10.7 9.5 9.2  Alkaline Phos 25 - 125 93 -  AST 14 - 40 25 -  ALT 10 - 40 26 -    Lab Results  Component Value Date   TOTALPROTELP 7.6 10/27/2020   ALBUMINELP 3.5 10/27/2020   A1GS 0.3 10/27/2020   A2GS 1.2 (H) 10/27/2020   BETS 1.4 (H) 10/27/2020   GAMS 1.2 10/27/2020   MSPIKE Not Observed 10/27/2020   SPEI Comment 10/27/2020    Ref. Range 10/27/2020 14:47  Iron Latest Ref Range: 45 - 182 ug/dL 40 (L)  UIBC Latest Units: ug/dL 756  TIBC Latest Ref Range: 250 - 450 ug/dL 433  Saturation Ratios Latest Ref Range: 17.9 - 39.5 % 11 (L)  Ferritin Latest Ref Range: 24 - 336 ng/mL 146  Folate Latest Ref Range: >5.9 ng/mL 4.3 (L)  Vitamin B12 Latest Ref Range: 180 - 914 pg/mL 958 (H)   ASSESSMENT & PLAN:  A 55 y.o. male who I was asked to consult upon for anemia.  When evaluating all of his labs, it appears this gentleman's main issue is iron deficiency anemia.  Based upon this, I will arrange for him to undergo IV iron therapy over these next few weeks to replenish his iron stores and normalize his hemoglobin.  I am in agreement with him needing a cytoscopy for his hematuria.  As mentioned previously, his EGD and colonoscopy from April 2021 were both normal.  I will see this gentleman back in 3 months to reassess his iron and hemoglobin levels after receiving IV iron.  The patient understands all the plans discussed today and is in agreement with them.  I do  appreciate Gordan Payment., MD for his new consult.   Dama Hedgepeth Kirby Funk, MD

## 2020-11-05 LAB — PROTEIN ELECTROPHORESIS, SERUM
A/G Ratio: 0.9 (ref 0.7–1.7)
Albumin ELP: 3.5 g/dL (ref 2.9–4.4)
Alpha-1-Globulin: 0.3 g/dL (ref 0.0–0.4)
Alpha-2-Globulin: 1.2 g/dL — ABNORMAL HIGH (ref 0.4–1.0)
Beta Globulin: 1.4 g/dL — ABNORMAL HIGH (ref 0.7–1.3)
Gamma Globulin: 1.2 g/dL (ref 0.4–1.8)
Globulin, Total: 4.1 g/dL — ABNORMAL HIGH (ref 2.2–3.9)
Total Protein ELP: 7.6 g/dL (ref 6.0–8.5)

## 2020-11-10 ENCOUNTER — Other Ambulatory Visit: Payer: Self-pay

## 2020-11-10 ENCOUNTER — Inpatient Hospital Stay: Payer: 59 | Attending: Oncology

## 2020-11-10 VITALS — BP 143/85 | HR 91 | Temp 98.2°F | Resp 18 | Ht 73.25 in | Wt 295.0 lb

## 2020-11-10 DIAGNOSIS — D509 Iron deficiency anemia, unspecified: Secondary | ICD-10-CM | POA: Diagnosis present

## 2020-11-10 DIAGNOSIS — D500 Iron deficiency anemia secondary to blood loss (chronic): Secondary | ICD-10-CM

## 2020-11-10 MED ORDER — SODIUM CHLORIDE 0.9 % IV SOLN
Freq: Once | INTRAVENOUS | Status: AC
  Filled 2020-11-10: qty 250

## 2020-11-10 MED ORDER — SODIUM CHLORIDE 0.9 % IV SOLN
300.0000 mg | Freq: Once | INTRAVENOUS | Status: AC
  Administered 2020-11-10: 300 mg via INTRAVENOUS
  Filled 2020-11-10: qty 10

## 2020-11-10 NOTE — Patient Instructions (Signed)

## 2020-11-17 ENCOUNTER — Inpatient Hospital Stay: Payer: 59

## 2020-11-17 ENCOUNTER — Other Ambulatory Visit: Payer: Self-pay

## 2020-11-17 VITALS — BP 128/78 | HR 80 | Temp 98.1°F | Resp 18 | Ht 73.25 in | Wt 289.0 lb

## 2020-11-17 DIAGNOSIS — D509 Iron deficiency anemia, unspecified: Secondary | ICD-10-CM | POA: Diagnosis not present

## 2020-11-17 DIAGNOSIS — D500 Iron deficiency anemia secondary to blood loss (chronic): Secondary | ICD-10-CM

## 2020-11-17 MED ORDER — SODIUM CHLORIDE 0.9 % IV SOLN
300.0000 mg | Freq: Once | INTRAVENOUS | Status: AC
  Administered 2020-11-17: 300 mg via INTRAVENOUS
  Filled 2020-11-17: qty 10

## 2020-11-17 MED ORDER — SODIUM CHLORIDE 0.9 % IV SOLN
Freq: Once | INTRAVENOUS | Status: AC
  Filled 2020-11-17: qty 250

## 2020-11-17 NOTE — Patient Instructions (Signed)

## 2021-01-20 NOTE — Progress Notes (Signed)
[facility]  [address] [place],  [place]  [address] [phone]  Clinic Day:  01/27/2021  Referring physician: Gordan Payment., MD  This document serves as a record of services personally performed by Georgetta Crafton Kirby Funk, MD. It was created on their behalf by [facility] E, a trained medical scribe. The creation of this record is based on the scribe's personal observations and the provider's statements to them.  HISTORY OF PRESENT ILLNESS:  The patient is a 55 y.o. male with iron deficiency anemia.  He comes in today to reassess his labs after receiving IV iron.  Of note, the patient claims his cystoscopy only revealed a kidney stone, which has since undergone lithotripsy.  Since then, he has had no further problems with hematuria.  He had both an EGD and colonoscopy done in April 2021, which both came back normal.  He denies having other overt forms of blood loss since his last visit.  PHYSICAL EXAM:  Blood pressure 132/80, pulse 93, temperature 97.8 F (36.6 C), resp. rate 16, height 6' 1.25" (1.861 m), weight 295 lb 6.4 oz (134 kg), SpO2 95 %. Wt Readings from Last 3 Encounters:  01/27/21 295 lb 6.4 oz (134 kg)  11/17/20 289 lb 0.6 oz (131.1 kg)  11/10/20 295 lb (133.8 kg)   Body mass index is 38.71 kg/m. Performance status (ECOG): 1 - Symptomatic but completely ambulatory Physical Exam Constitutional:      Appearance: Normal appearance. He is not ill-appearing.  HENT:     Mouth/Throat:     Mouth: Mucous membranes are moist.     Pharynx: Oropharynx is clear. No oropharyngeal exudate or posterior oropharyngeal erythema.  Cardiovascular:     Rate and Rhythm: Normal rate and regular rhythm.     Heart sounds: No murmur heard.   No friction rub. No gallop.  Pulmonary:     Effort: Pulmonary effort is normal. No respiratory distress.     Breath sounds: Normal breath sounds. No wheezing, rhonchi or rales.  Abdominal:     General: Bowel sounds  are normal. There is no distension.     Palpations: Abdomen is soft. There is no mass.     Tenderness: There is no abdominal tenderness.  Musculoskeletal:        General: No swelling.     Right lower leg: No edema.     Left lower leg: No edema.  Lymphadenopathy:     Cervical: No cervical adenopathy.     Upper Body:     Right upper body: No supraclavicular or axillary adenopathy.     Left upper body: No supraclavicular or axillary adenopathy.     Lower Body: No right inguinal adenopathy. No left inguinal adenopathy.  Skin:    General: Skin is warm.     Coloration: Skin is not jaundiced.     Findings: No lesion or rash.  Neurological:     General: No focal deficit present.     Mental Status: He is alert and oriented to person, place, and time. Mental status is at baseline.     Cranial Nerves: Cranial nerves are intact.  Psychiatric:        Mood and Affect: Mood normal.        Behavior: Behavior normal.        Thought Content: Thought content normal.   LABS:    Ref. Range 01/27/2021 00:00  WBC Unknown 5.1  RBC Latest Ref Range: 3.87 - 5.11  5.04  Hemoglobin  Latest Ref Range: 13.5 - 17.5  13.8  HCT Latest Ref Range: 41 - 53  41  MCV Latest Ref Range: 80 - 94  81  Platelets Latest Ref Range: 150 - 399  257  NEUT# Unknown 2.86    CMP Latest Ref Rng & Units 01/27/2021 10/27/2020 06/17/2017  Glucose 65 - 99 mg/dL - - 076(A)  BUN 4 - 21 16 17 12   Creatinine 0.6 - 1.3 1.0 0.7 0.74  Sodium 137 - 147 142 140 139  Potassium 3.4 - 5.3 4.3 3.6 4.4  Chloride 99 - 108 108 105 106  CO2 13 - 22 23(A) 23(A) 23  Calcium 8.7 - 10.7 9.0 9.5 9.2  Alkaline Phos 25 - 125 100 93 -  AST 14 - 40 36 25 -  ALT 10 - 40 51(A) 26 -    Ref. Range 10/27/2020 14:47  01/27/2021 08:50  Iron Latest Ref Range: 45 - 182 ug/dL 40 (L)  73  UIBC Latest Units: ug/dL 01/29/2021  263  TIBC Latest Ref Range: 250 - 450 ug/dL 335  456  Saturation Ratios Latest Ref Range: 17.9 - 39.5 % 11 (L)  19  Ferritin Latest Ref  Range: 24 - 336 ng/mL 146  263    ASSESSMENT & PLAN:  A 55 y.o. male with iron deficiency anemia.  His hemoglobin and iron levels have clearly improved since receiving IV iron.  He is also up to date on screening procedures to rule out an occult malignancy.  Clinically, he is doing well.  I will see him back in 6 months for repeat clinical assessment.  If his labs remain normal at that time, I will consider turning his care back over to his other physicians.  The patient understands all the plans discussed today and is in agreement with them.  I, 58, am acting as scribe for Foye Deer, MD    I have reviewed this report as typed by the medical scribe, and it is complete and accurate.  Dell Briner Weston Settle, MD

## 2021-01-27 ENCOUNTER — Telehealth: Payer: Self-pay | Admitting: Oncology

## 2021-01-27 ENCOUNTER — Inpatient Hospital Stay: Payer: 59

## 2021-01-27 ENCOUNTER — Inpatient Hospital Stay: Payer: 59 | Attending: Oncology | Admitting: Oncology

## 2021-01-27 ENCOUNTER — Telehealth: Payer: Self-pay

## 2021-01-27 ENCOUNTER — Other Ambulatory Visit: Payer: Self-pay | Admitting: Oncology

## 2021-01-27 ENCOUNTER — Encounter: Payer: Self-pay | Admitting: Oncology

## 2021-01-27 ENCOUNTER — Other Ambulatory Visit: Payer: Self-pay | Admitting: Hematology and Oncology

## 2021-01-27 VITALS — BP 132/80 | HR 93 | Temp 97.8°F | Resp 16 | Ht 73.25 in | Wt 295.4 lb

## 2021-01-27 DIAGNOSIS — D500 Iron deficiency anemia secondary to blood loss (chronic): Secondary | ICD-10-CM

## 2021-01-27 DIAGNOSIS — D509 Iron deficiency anemia, unspecified: Secondary | ICD-10-CM | POA: Diagnosis not present

## 2021-01-27 DIAGNOSIS — D508 Other iron deficiency anemias: Secondary | ICD-10-CM

## 2021-01-27 LAB — HEPATIC FUNCTION PANEL
ALT: 51 — AB (ref 10–40)
AST: 36 (ref 14–40)
Alkaline Phosphatase: 100 (ref 25–125)
Bilirubin, Total: 0.4

## 2021-01-27 LAB — FERRITIN: Ferritin: 263 ng/mL (ref 24–336)

## 2021-01-27 LAB — BASIC METABOLIC PANEL
BUN: 16 (ref 4–21)
CO2: 23 — AB (ref 13–22)
Chloride: 108 (ref 99–108)
Creatinine: 1 (ref 0.6–1.3)
Glucose: 162
Potassium: 4.3 (ref 3.4–5.3)
Sodium: 142 (ref 137–147)

## 2021-01-27 LAB — CBC AND DIFFERENTIAL
HCT: 41 (ref 41–53)
Hemoglobin: 13.8 (ref 13.5–17.5)
Neutrophils Absolute: 2.86
Platelets: 257 (ref 150–399)
WBC: 5.1

## 2021-01-27 LAB — CBC
MCV: 81 (ref 80–94)
RBC: 5.04 (ref 3.87–5.11)

## 2021-01-27 LAB — COMPREHENSIVE METABOLIC PANEL
Albumin: 4.4 (ref 3.5–5.0)
Calcium: 9 (ref 8.7–10.7)

## 2021-01-27 LAB — IRON AND TIBC
Iron: 73 ug/dL (ref 45–182)
Saturation Ratios: 19 % (ref 17.9–39.5)
TIBC: 385 ug/dL (ref 250–450)
UIBC: 312 ug/dL

## 2021-01-27 NOTE — Telephone Encounter (Signed)
Dr Melvyn Neth reviewed the labs and states that "iron numbers are fine". Pt's wife notified of above and to f/u in 6 months. She verbalized understanding.

## 2021-01-27 NOTE — Telephone Encounter (Signed)
Per 10/18 LOS, patient scheduled for April 2023 Appt's.  Gave patient Appt Summary 

## 2021-07-27 NOTE — Progress Notes (Signed)
?[facility]  ?[address] ?[place],  [place]  [address] ?[phone] ? ?Clinic Day:  07/28/2021 ? ?Referring physician: Raina Mina., MD ? ? ?HISTORY OF PRESENT ILLNESS:  ?The patient is a 56 y.o. male with a history of iron deficiency anemia.  He comes in today for repeat clinical assessment.   Since his last visit, the patient has been doing fine.  He has had no further problems with hematuria from underlying nephrolithiasis.  He denies having other overt forms of blood loss or any fatigue which concerns him for recurrent iron deficiency anemia.   ? ?PHYSICAL EXAM:  ?Blood pressure (!) 135/98, pulse 75, temperature 97.7 ?F (36.5 ?C), resp. rate 18, height 6' 1.25" (1.861 m), weight (!) 304 lb 14.4 oz (138.3 kg), SpO2 95 %. ?Wt Readings from Last 3 Encounters:  ?07/28/21 (!) 304 lb 14.4 oz (138.3 kg)  ?01/27/21 295 lb 6.4 oz (134 kg)  ?11/17/20 289 lb 0.6 oz (131.1 kg)  ? ?Body mass index is 39.95 kg/m?Marland Kitchen ?Performance status (ECOG): 1 - Symptomatic but completely ambulatory ?Physical Exam ?Constitutional:   ?   Appearance: Normal appearance. He is not ill-appearing.  ?HENT:  ?   Mouth/Throat:  ?   Mouth: Mucous membranes are moist.  ?   Pharynx: Oropharynx is clear. No oropharyngeal exudate or posterior oropharyngeal erythema.  ?Cardiovascular:  ?   Rate and Rhythm: Normal rate and regular rhythm.  ?   Heart sounds: No murmur heard. ?  No friction rub. No gallop.  ?Pulmonary:  ?   Effort: Pulmonary effort is normal. No respiratory distress.  ?   Breath sounds: Normal breath sounds. No wheezing, rhonchi or rales.  ?Abdominal:  ?   General: Bowel sounds are normal. There is no distension.  ?   Palpations: Abdomen is soft. There is no mass.  ?   Tenderness: There is no abdominal tenderness.  ?Musculoskeletal:     ?   General: No swelling.  ?   Right lower leg: No edema.  ?   Left lower leg: No edema.  ?Lymphadenopathy:  ?   Cervical: No cervical adenopathy.  ?   Upper Body:  ?    Right upper body: No supraclavicular or axillary adenopathy.  ?   Left upper body: No supraclavicular or axillary adenopathy.  ?   Lower Body: No right inguinal adenopathy. No left inguinal adenopathy.  ?Skin: ?   General: Skin is warm.  ?   Coloration: Skin is not jaundiced.  ?   Findings: No lesion or rash.  ?Neurological:  ?   General: No focal deficit present.  ?   Mental Status: He is alert and oriented to person, place, and time. Mental status is at baseline.  ?Psychiatric:     ?   Mood and Affect: Mood normal.     ?   Behavior: Behavior normal.     ?   Thought Content: Thought content normal.  ? ?LABS:  ? ? ? Latest Reference Range & Units 07/28/21 08:33  ?Iron 45 - 182 ug/dL 71  ?UIBC ug/dL 330  ?TIBC 250 - 450 ug/dL 401  ?Saturation Ratios 17.9 - 39.5 % 18  ?Ferritin 24 - 336 ng/mL 103  ? ? ?ASSESSMENT & PLAN:  ?A 56 y.o. male with iron deficiency anemia.  His hemoglobin and iron levels remain ideal today.  Clinically, he is doing well.  As that is the case, I will turn his care back over to  his primary care office.  My recommendation would be for a CBC to be checked 2-3 times yearly.  I would not have a problem seeing him in the future if any new hematologic issues arise that require repeat clinical assessment.  The patient understands all the plans discussed today and is in agreement with them. ? ? ?Russell Chavira Macarthur Critchley, MD ? ? ? ?  ? ?

## 2021-07-28 ENCOUNTER — Inpatient Hospital Stay: Payer: 59 | Attending: Oncology

## 2021-07-28 ENCOUNTER — Telehealth: Payer: Self-pay

## 2021-07-28 ENCOUNTER — Inpatient Hospital Stay (INDEPENDENT_AMBULATORY_CARE_PROVIDER_SITE_OTHER): Payer: 59 | Admitting: Oncology

## 2021-07-28 VITALS — BP 135/98 | HR 75 | Temp 97.7°F | Resp 18 | Ht 73.25 in | Wt 304.9 lb

## 2021-07-28 DIAGNOSIS — D509 Iron deficiency anemia, unspecified: Secondary | ICD-10-CM | POA: Insufficient documentation

## 2021-07-28 DIAGNOSIS — D500 Iron deficiency anemia secondary to blood loss (chronic): Secondary | ICD-10-CM

## 2021-07-28 LAB — CBC AND DIFFERENTIAL
HCT: 43 (ref 41–53)
Hemoglobin: 13.8 (ref 13.5–17.5)
Neutrophils Absolute: 2.49
Platelets: 248 10*3/uL (ref 150–400)
WBC: 4.3

## 2021-07-28 LAB — IRON AND TIBC
Iron: 71 ug/dL (ref 45–182)
Saturation Ratios: 18 % (ref 17.9–39.5)
TIBC: 401 ug/dL (ref 250–450)
UIBC: 330 ug/dL

## 2021-07-28 LAB — CBC: RBC: 5.08 (ref 3.87–5.11)

## 2021-07-28 LAB — FERRITIN: Ferritin: 103 ng/mL (ref 24–336)

## 2021-07-28 NOTE — Telephone Encounter (Signed)
Dr Bobby Rumpf reviewed labs, and states "iron parameters are all normal, discharging pt from clinic". I notified pt's wife of above. She voiced appreciation of care given by Dr Bobby Rumpf. ? ? ? Latest Reference Range & Units 07/28/21 00:00 07/28/21 08:33  ?Iron 45 - 182 ug/dL  71  ?UIBC ug/dL  330  ?TIBC 250 - 450 ug/dL  401  ?Saturation Ratios 17.9 - 39.5 %  18  ?Ferritin 24 - 336 ng/mL  103  ?WBC  4.3 (E)   ?RBC 3.87 - 5.11  5.08 (E)   ?Hemoglobin 13.5 - 17.5  13.8 (E)   ?HCT 41 - 53  43 (E)   ?Platelets 150 - 400 K/ ? ?uL 248 (E)   ?NEUT#  2.49 (E)   ?(E): External lab result ?

## 2022-07-06 ENCOUNTER — Encounter: Payer: Self-pay | Admitting: Oncology
